# Patient Record
Sex: Male | Born: 1995
Health system: Southern US, Community
[De-identification: ages and names within clinical notes are randomized; demographics above are authoritative.]

## PROBLEM LIST (undated history)

## (undated) DIAGNOSIS — M0880 Other juvenile arthritis, unspecified site: Secondary | ICD-10-CM

## (undated) DIAGNOSIS — R197 Diarrhea, unspecified: Secondary | ICD-10-CM

## (undated) DIAGNOSIS — M0890 Juvenile arthritis, unspecified, unspecified site: Secondary | ICD-10-CM

## (undated) DIAGNOSIS — F845 Asperger's syndrome: Secondary | ICD-10-CM

## (undated) HISTORY — DX: Juvenile arthritis, unspecified, unspecified site: M08.90

## (undated) HISTORY — DX: Diarrhea, unspecified: R19.7

## (undated) HISTORY — DX: Asperger's syndrome: F84.5

## (undated) HISTORY — DX: Other juvenile arthritis, unspecified site: M08.80

---
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
## 2015-05-17 MED FILL — VYVANSE 70 MG CAPSULE: 70 | 90 days supply | Qty: 90 | Fill #0

## 2015-05-17 MED FILL — ARIPiprazole 20 MG TABS: 20 | 90 days supply | Qty: 90 | Fill #1

## 2015-06-22 MED FILL — LIALDA 1.2 GM TABLET SA: 1.2 | 90 days supply | Qty: 360 | Fill #1

## 2015-08-15 MED FILL — cloNIDine HCL 0.1 MG TABS: 0.1 | 90 days supply | Qty: 270 | Fill #0

## 2015-08-15 MED FILL — ARIPiprazole 20 MG TABS: 20 | 90 days supply | Qty: 90 | Fill #0

## 2015-08-16 MED FILL — VYVANSE 70 MG CAPSULE: 70 | 90 days supply | Qty: 90 | Fill #0

## 2015-09-20 MED FILL — LIALDA 1.2 GM TABLET SA: 1.2 | 30 days supply | Qty: 120 | Fill #0

## 2015-10-21 MED FILL — LIALDA 1.2 GM TABLET SA: 1.2 | 30 days supply | Qty: 120 | Fill #1

## 2015-10-21 MED FILL — CLOBETASOL 0.05% OINTMENT: 0.05 | 7 days supply | Qty: 15 | Fill #0

## 2015-10-21 MED FILL — AMOXICILLIN 875 MG TABLET: 875 | 10 days supply | Qty: 20 | Fill #0

## 2015-11-16 MED FILL — ARIPiprazole 20 MG TABS: 20 | 30 days supply | Qty: 30 | Fill #0

## 2015-11-23 MED FILL — LIALDA 1.2 GM TABLET SA: 1.2 | 30 days supply | Qty: 120 | Fill #2

## 2015-11-23 MED FILL — VYVANSE 70 MG CAPSULE: 70 | 30 days supply | Qty: 30 | Fill #0

## 2015-12-19 MED FILL — LIALDA 1.2 GM TABLET SA: 1.2 | 30 days supply | Qty: 120 | Fill #3

## 2016-01-20 MED FILL — LIALDA 1.2 GM TABLET SA: 1.2 | 30 days supply | Qty: 120 | Fill #4

## 2016-04-08 ENCOUNTER — Ambulatory Visit (HOSPITAL_COMMUNITY)
Admission: EM | Admit: 2016-04-08 | Discharge: 2016-04-08 | Disposition: A | Discharge status: Home / Self Care | Payer: 59 | Attending: Emergency Medicine | Admitting: Emergency Medicine

## 2016-04-08 ENCOUNTER — Encounter (HOSPITAL_COMMUNITY): Payer: Self-pay | Admitting: *Deleted

## 2016-04-08 DIAGNOSIS — J069 Acute upper respiratory infection, unspecified: Secondary | ICD-10-CM | POA: Diagnosis not present

## 2016-04-08 DIAGNOSIS — B9789 Other viral agents as the cause of diseases classified elsewhere: Secondary | ICD-10-CM | POA: Diagnosis not present

## 2016-04-08 MED ORDER — IPRATROPIUM BROMIDE 0.06 % NA SOLN: 2.0000 | Freq: Four times a day (QID) | NASAL | 0 refills | Status: AC

## 2016-04-08 MED ORDER — AZITHROMYCIN 250 MG PO TABS: ORAL_TABLET | ORAL | 0 refills | Status: DC

## 2016-04-08 NOTE — Discharge Instructions (Signed)
It is likely viral at this time. However, since he is on an immunosuppressant, we are going to treat with antibiotics. Give him azithromycin as prescribed. Use Atrovent nasal spray 4 times a day as needed for nasal congestion and runny nose. Tylenol or ibuprofen as needed for fevers. Follow up as needed.

## 2016-04-08 NOTE — ED Provider Notes (Signed)
[facility]    CSN: [id number] Arrival date & time: 04/08/16  1650     History   Chief Complaint No chief complaint on file. CC: cough  HPI Richard Hess is a 20 y.o. male.   HPI He is a 20 year old man here with his mom for evaluation of cough. Symptoms started today with cough, congestion, rhinorrhea, fevers, and achiness. He has taken Tylenol with some improvement. He has been exposed to people with both pneumonia and bronchitis. He denies any wheezing or shortness of breath. No nausea or vomiting.  Past Medical History:  Diagnosis Date  . Asperger syndrome   . Diarrhea   . Juvenile idiopathic arthritis     Patient Active Problem List   Diagnosis Date Noted  . Asperger syndrome 05/28/2011  . Idiopathic ulcerative colitis (HCC) 03/26/2011  . Juvenile idiopathic arthritis [facility](HCC)     Past Surgical History:  Procedure Laterality Date  . TESTICLE TORSION REDUCTION  may 2012       Home Medications    Prior to Admission medications   Medication Sig Start Date End Date Taking? Authorizing Provider  adalimumab (HUMIRA PEN) 40 MG/0.8ML injection Inject 0.8 mLs (40 mg total) into the skin every 14 (fourteen) days. 06/15/13   Jon GillsJoseph H Clark, MD  amphetamine-dextroamphetamine (ADDERALL) 5 MG tablet Take 10 mg by mouth daily.     Historical Provider, MD  ARIPiprazole (ABILIFY) 20 MG tablet Take 20 mg by mouth daily.      Historical Provider, MD  azithromycin (ZITHROMAX Z-PAK) 250 MG tablet Take 2 pills today, then 1 pill daily until gone. 04/08/16   Charm RingsErin J Honig, MD  cloNIDine (CATAPRES) 0.1 MG tablet Take 0.1 mg by mouth 3 (three) times daily.     Historical Provider, MD  ipratropium (ATROVENT) 0.06 % nasal spray Place 2 sprays into both nostrils 4 (four) times daily. 04/08/16   Charm RingsErin J Honig, MD  lisdexamfetamine (VYVANSE) 70 MG capsule Take 70 mg by mouth every morning.      Historical Provider, MD  Multiple Vitamin (MULTIVITAMIN) tablet Take 1 tablet by mouth  daily.      Historical Provider, MD  sulfaSALAzine (AZULFIDINE) 500 MG EC tablet Take 4 tablets (2,000 mg total) by mouth 2 (two) times daily. 11/16/13 11/17/14  Jon GillsJoseph H Clark, MD    Family History Family History  Problem Relation Age of Onset  . Ankylosing spondylitis Brother   . Celiac disease Neg Hx   . Inflammatory bowel disease Neg Hx   . Vesicoureteral reflux Brother     Social History Social History  Substance Use Topics  . Smoking status: Passive Smoke Exposure - Never Smoker  . Smokeless tobacco: Never Used  . Alcohol use No     Allergies   Divalproex sodium and Sertraline hcl   Review of Systems Review of Systems As in history of present illness  Physical Exam Triage Vital Signs ED Triage Vitals [04/08/16 1755]  Enc Vitals Group     BP 108/71     Pulse Rate 88     Resp 16     Temp 98.2 F (36.8 C)     Temp Source Oral     SpO2 98 %     Weight      Height      Head Circumference      Peak Flow      Pain Score      Pain Loc      Pain Edu?  Excl. in GC?    No data found.   Updated Vital Signs BP 108/71 (BP Location: Left Arm)   Pulse 88   Temp 98.2 F (36.8 C) (Oral)   Resp 16   SpO2 98%   Visual Acuity Right Eye Distance:   Left Eye Distance:   Bilateral Distance:    Right Eye Near:   Left Eye Near:    Bilateral Near:     Physical Exam  Constitutional: He is oriented to person, place, and time. He appears well-developed and well-nourished. No distress.  HENT:  Mouth/Throat: No oropharyngeal exudate.  Nasal mucosa is erythematous. TMs normal bilaterally. Oropharynx is erythematous and tonsils are enlarged.  Neck: Neck supple.  Cardiovascular: Normal rate, regular rhythm and normal heart sounds.   No murmur heard. Pulmonary/Chest: Effort normal and breath sounds normal. No respiratory distress. He has no wheezes. He has no rales.  Lymphadenopathy:    He has no cervical adenopathy.  Neurological: He is alert and oriented to  person, place, and time.     UC Treatments / Results  Labs (all labs ordered are listed, but only abnormal results are displayed) Labs Reviewed - No data to display  EKG  EKG Interpretation None       Radiology No results found.  Procedures Procedures (including critical care time)  Medications Ordered in UC Medications - No data to display   Initial Impression / Assessment and Plan / UC Course  I have reviewed the triage vital signs and the nursing notes.  Pertinent labs & imaging results that were available during my care of the patient were reviewed by me and considered in my medical decision making (see chart for details).  Clinical Course     Likely viral at this time, however since he is on Humira will cover with a Z-Pak. Atrovent nasal spray for symptom relief. Tylenol and ibuprofen as needed for fevers. Follow up as needed.  Final Clinical Impressions(s) / UC Diagnoses   Final diagnoses:  Viral URI with cough    New Prescriptions New Prescriptions   AZITHROMYCIN (ZITHROMAX Z-PAK) 250 MG TABLET    Take 2 pills today, then 1 pill daily until gone.   IPRATROPIUM (ATROVENT) 0.06 % NASAL SPRAY    Place 2 sprays into both nostrils 4 (four) times daily.     Charm RingsErin J Honig, MD 04/08/16 [phone]

## 2016-04-08 NOTE — ED Triage Notes (Signed)
Pt  Reports  Sinus   Drainage    With headache       Today         Sibling  Has  pnuemonia  Pt     Is  Congested        Yet  Is  Awake   And  Alert

## 2016-04-30 DIAGNOSIS — Z79899 Other long term (current) drug therapy: Secondary | ICD-10-CM | POA: Diagnosis not present

## 2016-04-30 DIAGNOSIS — M450 Ankylosing spondylitis of multiple sites in spine: Secondary | ICD-10-CM | POA: Diagnosis not present

## 2016-04-30 DIAGNOSIS — B370 Candidal stomatitis: Secondary | ICD-10-CM | POA: Diagnosis not present

## 2016-04-30 MED FILL — AZITHROMYCIN 250 MG TABLET: 250 | 5 days supply | Qty: 6 | Fill #0

## 2016-04-30 MED FILL — FLUCONAZOLE 150 MG TABLET: 150 | 10 days supply | Qty: 10 | Fill #0

## 2016-06-22 DIAGNOSIS — J329 Chronic sinusitis, unspecified: Secondary | ICD-10-CM | POA: Diagnosis not present

## 2016-06-22 MED FILL — AZITHROMYCIN 250 MG TABLET: 250 | 5 days supply | Qty: 6 | Fill #0

## 2016-07-27 DIAGNOSIS — L4050 Arthropathic psoriasis, unspecified: Secondary | ICD-10-CM | POA: Diagnosis not present

## 2016-07-27 DIAGNOSIS — L409 Psoriasis, unspecified: Secondary | ICD-10-CM | POA: Diagnosis not present

## 2016-07-27 DIAGNOSIS — M450 Ankylosing spondylitis of multiple sites in spine: Secondary | ICD-10-CM | POA: Diagnosis not present

## 2016-07-27 MED FILL — AZITHROMYCIN 250 MG TABLET: 250 | 5 days supply | Qty: 6 | Fill #0

## 2016-08-31 DIAGNOSIS — E785 Hyperlipidemia, unspecified: Secondary | ICD-10-CM | POA: Diagnosis not present

## 2016-08-31 DIAGNOSIS — Z79899 Other long term (current) drug therapy: Secondary | ICD-10-CM | POA: Diagnosis not present

## 2016-08-31 DIAGNOSIS — M450 Ankylosing spondylitis of multiple sites in spine: Secondary | ICD-10-CM | POA: Diagnosis not present

## 2016-09-14 DIAGNOSIS — Z0000 Encounter for general adult medical examination without abnormal findings: Secondary | ICD-10-CM | POA: Diagnosis not present

## 2016-09-14 DIAGNOSIS — E785 Hyperlipidemia, unspecified: Secondary | ICD-10-CM | POA: Diagnosis not present

## 2016-09-14 DIAGNOSIS — M450 Ankylosing spondylitis of multiple sites in spine: Secondary | ICD-10-CM | POA: Diagnosis not present

## 2016-10-26 DIAGNOSIS — M450 Ankylosing spondylitis of multiple sites in spine: Secondary | ICD-10-CM | POA: Diagnosis not present

## 2016-10-26 DIAGNOSIS — L409 Psoriasis, unspecified: Secondary | ICD-10-CM | POA: Diagnosis not present

## 2016-10-26 DIAGNOSIS — L4050 Arthropathic psoriasis, unspecified: Secondary | ICD-10-CM | POA: Diagnosis not present

## 2017-02-12 DIAGNOSIS — L409 Psoriasis, unspecified: Secondary | ICD-10-CM | POA: Diagnosis not present

## 2017-02-12 DIAGNOSIS — L4050 Arthropathic psoriasis, unspecified: Secondary | ICD-10-CM | POA: Diagnosis not present

## 2017-02-12 DIAGNOSIS — M450 Ankylosing spondylitis of multiple sites in spine: Secondary | ICD-10-CM | POA: Diagnosis not present

## 2017-02-21 DIAGNOSIS — K5190 Ulcerative colitis, unspecified, without complications: Secondary | ICD-10-CM | POA: Diagnosis not present

## 2017-03-15 DIAGNOSIS — Z0000 Encounter for general adult medical examination without abnormal findings: Secondary | ICD-10-CM | POA: Diagnosis not present

## 2017-03-21 DIAGNOSIS — K5100 Ulcerative (chronic) pancolitis without complications: Secondary | ICD-10-CM | POA: Diagnosis not present

## 2017-03-21 DIAGNOSIS — R197 Diarrhea, unspecified: Secondary | ICD-10-CM | POA: Diagnosis not present

## 2017-03-21 DIAGNOSIS — K6389 Other specified diseases of intestine: Secondary | ICD-10-CM | POA: Diagnosis not present

## 2017-03-22 DIAGNOSIS — Z0001 Encounter for general adult medical examination with abnormal findings: Secondary | ICD-10-CM | POA: Diagnosis not present

## 2017-03-25 DIAGNOSIS — M545 Low back pain: Secondary | ICD-10-CM | POA: Diagnosis not present

## 2017-03-25 DIAGNOSIS — M533 Sacrococcygeal disorders, not elsewhere classified: Secondary | ICD-10-CM | POA: Diagnosis not present

## 2017-03-25 DIAGNOSIS — L4050 Arthropathic psoriasis, unspecified: Secondary | ICD-10-CM | POA: Diagnosis not present

## 2017-03-29 DIAGNOSIS — M533 Sacrococcygeal disorders, not elsewhere classified: Secondary | ICD-10-CM | POA: Diagnosis not present

## 2017-05-01 DIAGNOSIS — M533 Sacrococcygeal disorders, not elsewhere classified: Secondary | ICD-10-CM | POA: Diagnosis not present

## 2017-05-18 ENCOUNTER — Encounter (HOSPITAL_COMMUNITY): Payer: Self-pay | Admitting: Emergency Medicine

## 2017-05-18 ENCOUNTER — Ambulatory Visit (HOSPITAL_COMMUNITY)
Admission: EM | Admit: 2017-05-18 | Discharge: 2017-05-18 | Disposition: A | Discharge status: Home / Self Care | Payer: 59 | Attending: Family Medicine | Admitting: Family Medicine

## 2017-05-18 DIAGNOSIS — J020 Streptococcal pharyngitis: Secondary | ICD-10-CM | POA: Diagnosis not present

## 2017-05-18 LAB — POCT RAPID STREP A: Streptococcus, Group A Screen (Direct): POSITIVE — AB

## 2017-05-18 MED ORDER — AMOXICILLIN 500 MG PO CAPS: 1000.0000 mg | ORAL_CAPSULE | Freq: Every day | ORAL | 0 refills | Status: AC

## 2017-05-18 NOTE — ED Triage Notes (Signed)
PT woke up with sore throat, fever yesterday morning.

## 2017-05-18 NOTE — Discharge Instructions (Signed)
Throw out your toothbrush after 24 hours of antibiotics.   Ibuprofen 400-600 mg (2-3 over the counter strength tabs) every 6 hours as needed for pain.

## 2017-05-18 NOTE — ED Provider Notes (Signed)
[facility]    CSN: [id number] Arrival date & time: 05/18/17  1629     History   Chief Complaint Chief Complaint  Patient presents with  . Sore Throat    HPI Richard Hess is a 22 y.o. male.   HPI  Duration: 1 day  Associated symptoms: sinus congestion, sore throat and mild cough, fevers Denies: sinus congestion, sinus pain, itchy watery eyes, ear pain, ear drainage, wheezing, shortness of breath and myalgia Treatment to date: none Sick contacts: Yes    Past Medical History:  Diagnosis Date  . Asperger syndrome   . Diarrhea   . Juvenile idiopathic arthritis [facility])     Patient Active Problem List   Diagnosis Date Noted  . Asperger syndrome 05/28/2011  . Idiopathic ulcerative colitis (HCC) 03/26/2011  . Juvenile idiopathic arthritis [facility])     Past Surgical History:  Procedure Laterality Date  . TESTICLE TORSION REDUCTION  may 2012       Home Medications    Prior to Admission medications   Medication Sig Start Date End Date Taking? Authorizing Provider  ARIPiprazole (ABILIFY) 20 MG tablet Take 20 mg by mouth daily.     Yes [provider]  Certolizumab Pegol (CIMZIA ) Inject into the skin.   Yes [provider]  lisdexamfetamine (VYVANSE) 70 MG capsule Take 70 mg by mouth every morning.     Yes [provider]  mesalamine (LIALDA) 1.2 g EC tablet Take 4.8 g by mouth daily with breakfast.   Yes [provider]  Multiple Vitamin (MULTIVITAMIN) tablet Take 1 tablet by mouth daily.     Yes [provider]  adalimumab (HUMIRA PEN) 40 MG/0.8ML injection Inject 0.8 mLs (40 mg total) into the skin every 14 (fourteen) days. 06/15/13   Jon Gills, MD  amoxicillin (AMOXIL) 500 MG capsule Take 2 capsules (1,000 mg total) by mouth daily for 10 days. 05/18/17 05/28/17  Sharlene Dory, DO  amphetamine-dextroamphetamine (ADDERALL) 5 MG tablet Take 10 mg by mouth daily.     [provider]  cloNIDine  (CATAPRES) 0.1 MG tablet Take 0.1 mg by mouth daily.     [provider]  ipratropium (ATROVENT) 0.06 % nasal spray Place 2 sprays into both nostrils 4 (four) times daily. 04/08/16   Charm Rings, MD  sulfaSALAzine (AZULFIDINE) 500 MG EC tablet Take 4 tablets (2,000 mg total) by mouth 2 (two) times daily. 11/16/13 11/17/14  Jon Gills, MD    Family History Family History  Problem Relation Age of Onset  . Ankylosing spondylitis Brother   . Vesicoureteral reflux Brother   . Celiac disease Neg Hx   . Inflammatory bowel disease Neg Hx     Social History Social History   Tobacco Use  . Smoking status: Passive Smoke Exposure - Never Smoker  . Smokeless tobacco: Never Used  Substance Use Topics  . Alcohol use: No  . Drug use: No     Allergies   Divalproex sodium and Sertraline hcl   Review of Systems Review of Systems  Constitutional: Positive for fever.  Respiratory: Positive for cough.      Physical Exam Triage Vital Signs ED Triage Vitals [05/18/17 1745]  Enc Vitals Group     BP 114/73     Pulse Rate 100     Resp 18     Temp 100.2 F (37.9 C)     Temp Source Oral     SpO2 97 %  Weight 205 lb (93 kg)   Updated Vital Signs BP 114/73   Pulse 100   Temp 100.2 F (37.9 C) (Oral)   Resp 18   Wt 205 lb (93 kg)   SpO2 97%   BMI 30.94 kg/m   Physical Exam  Constitutional: He appears well-developed.  HENT:  Head: Normocephalic.  Right Ear: Tympanic membrane normal.  Left Ear: Tympanic membrane normal.  Mouth/Throat: Oropharyngeal exudate and posterior oropharyngeal erythema present.  Cardiovascular: Normal rate and regular rhythm.  No murmur heard. Pulmonary/Chest: Effort normal and breath sounds normal. No respiratory distress.  Lymphadenopathy:    He has cervical adenopathy.  Psychiatric: He has a normal mood and affect. His behavior is normal.     UC Treatments / Results  Labs (all labs ordered are listed, but only abnormal results  are displayed) Labs Reviewed  POCT RAPID STREP A - Abnormal; Notable for the following components:      Result Value   Streptococcus, Group A Screen (Direct) POSITIVE (*)    All other components within normal limits   Procedures Procedures none  Initial Impression / Assessment and Plan / UC Course  I have reviewed the triage vital signs and the nursing notes.  Pertinent labs & imaging results that were available during my care of the patient were reviewed by me and considered in my medical decision making (see chart for details).     Pt presents w s/s's concerning for strep. 3/4 Centor, positive rapid. Amox for 10 d. Ibuprofen. Supportive care. F/u prn.  The patient voiced understanding and agreement to the plan.  Final Clinical Impressions(s) / UC Diagnoses   Final diagnoses:  Strep throat    ED Discharge Orders        Ordered    amoxicillin (AMOXIL) 500 MG capsule  Daily     05/18/17 1814       Controlled Substance Prescriptions [place] Controlled Substance Registry consulted? Not Applicable   Sharlene DoryWendling, Tom Macpherson Paul, [place] 05/18/17 1823

## 2017-06-14 DIAGNOSIS — K5190 Ulcerative colitis, unspecified, without complications: Secondary | ICD-10-CM | POA: Diagnosis not present

## 2017-06-14 DIAGNOSIS — R195 Other fecal abnormalities: Secondary | ICD-10-CM | POA: Diagnosis not present

## 2017-07-20 DIAGNOSIS — R319 Hematuria, unspecified: Secondary | ICD-10-CM | POA: Diagnosis not present

## 2017-08-23 DIAGNOSIS — L4050 Arthropathic psoriasis, unspecified: Secondary | ICD-10-CM | POA: Diagnosis not present

## 2017-08-23 DIAGNOSIS — M461 Sacroiliitis, not elsewhere classified: Secondary | ICD-10-CM | POA: Diagnosis not present

## 2017-08-23 DIAGNOSIS — M450 Ankylosing spondylitis of multiple sites in spine: Secondary | ICD-10-CM | POA: Diagnosis not present

## 2017-10-31 DIAGNOSIS — M459 Ankylosing spondylitis of unspecified sites in spine: Secondary | ICD-10-CM | POA: Diagnosis not present

## 2017-10-31 DIAGNOSIS — R310 Gross hematuria: Secondary | ICD-10-CM | POA: Diagnosis not present

## 2017-10-31 DIAGNOSIS — L4052 Psoriatic arthritis mutilans: Secondary | ICD-10-CM | POA: Diagnosis not present

## 2017-11-07 DIAGNOSIS — R310 Gross hematuria: Secondary | ICD-10-CM | POA: Diagnosis not present

## 2017-12-05 DIAGNOSIS — R310 Gross hematuria: Secondary | ICD-10-CM | POA: Diagnosis not present

## 2017-12-24 DIAGNOSIS — E559 Vitamin D deficiency, unspecified: Secondary | ICD-10-CM | POA: Diagnosis not present

## 2018-03-19 DIAGNOSIS — Z0000 Encounter for general adult medical examination without abnormal findings: Secondary | ICD-10-CM | POA: Diagnosis not present

## 2018-03-24 DIAGNOSIS — L4050 Arthropathic psoriasis, unspecified: Secondary | ICD-10-CM | POA: Diagnosis not present

## 2018-03-24 DIAGNOSIS — M450 Ankylosing spondylitis of multiple sites in spine: Secondary | ICD-10-CM | POA: Diagnosis not present

## 2018-03-24 DIAGNOSIS — M461 Sacroiliitis, not elsewhere classified: Secondary | ICD-10-CM | POA: Diagnosis not present

## 2018-03-25 DIAGNOSIS — E785 Hyperlipidemia, unspecified: Secondary | ICD-10-CM | POA: Diagnosis not present

## 2018-03-25 DIAGNOSIS — M450 Ankylosing spondylitis of multiple sites in spine: Secondary | ICD-10-CM | POA: Diagnosis not present

## 2018-03-25 DIAGNOSIS — Z0000 Encounter for general adult medical examination without abnormal findings: Secondary | ICD-10-CM | POA: Diagnosis not present

## 2018-12-09 ENCOUNTER — Encounter: Payer: 59 | Admitting: Neurology

## 2019-01-19 ENCOUNTER — Other Ambulatory Visit: Payer: Self-pay | Admitting: *Deleted

## 2019-01-19 DIAGNOSIS — Z20822 Contact with and (suspected) exposure to covid-19: Secondary | ICD-10-CM

## 2019-01-20 ENCOUNTER — Telehealth: Payer: Self-pay | Admitting: *Deleted

## 2019-01-20 LAB — NOVEL CORONAVIRUS, NAA: SARS-CoV-2, NAA: DETECTED — AB

## 2019-01-20 NOTE — Telephone Encounter (Signed)
Pt returned call for covid-19 test results. He is advised that he is positive for the virus. 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 He is advised to be quarantine for 14 days and to have his family living with him to get tested also. He voiced understanding. He is advised to hydrate, get fresh air and rest. To call back for any concerns and the health department will be notified. He voiced understanding.

## 2019-01-30 ENCOUNTER — Other Ambulatory Visit: Payer: Self-pay

## 2019-01-30 DIAGNOSIS — Z20822 Contact with and (suspected) exposure to covid-19: Secondary | ICD-10-CM

## 2019-01-31 LAB — NOVEL CORONAVIRUS, NAA: SARS-CoV-2, NAA: NOT DETECTED
